# Patient Record
Sex: Male | Born: 1965 | Race: White | Hispanic: No | Marital: Married | Smoking: Current every day smoker
Health system: Southern US, Community
[De-identification: ages and names within clinical notes are randomized; demographics above are authoritative.]

## PROBLEM LIST (undated history)

## (undated) HISTORY — PX: SHOULDER SURGERY: SHX246

---

## 2006-01-19 ENCOUNTER — Ambulatory Visit: Payer: Self-pay | Admitting: Family Medicine

## 2006-02-04 ENCOUNTER — Ambulatory Visit: Payer: Self-pay | Admitting: Family Medicine

## 2006-02-11 ENCOUNTER — Ambulatory Visit: Payer: Self-pay | Admitting: Family Medicine

## 2006-02-23 ENCOUNTER — Ambulatory Visit: Payer: Self-pay | Admitting: Internal Medicine

## 2006-03-14 ENCOUNTER — Ambulatory Visit: Payer: Self-pay | Admitting: Family Medicine

## 2006-03-25 ENCOUNTER — Ambulatory Visit: Payer: Self-pay | Admitting: Internal Medicine

## 2006-09-13 DIAGNOSIS — G4700 Insomnia, unspecified: Secondary | ICD-10-CM | POA: Insufficient documentation

## 2006-09-13 DIAGNOSIS — F339 Major depressive disorder, recurrent, unspecified: Secondary | ICD-10-CM | POA: Insufficient documentation

## 2006-09-13 DIAGNOSIS — J45909 Unspecified asthma, uncomplicated: Secondary | ICD-10-CM | POA: Insufficient documentation

## 2006-12-15 ENCOUNTER — Ambulatory Visit: Payer: Self-pay | Admitting: Family Medicine

## 2006-12-15 ENCOUNTER — Encounter: Payer: Self-pay | Admitting: Family Medicine

## 2006-12-15 LAB — CONVERTED CEMR LAB: Inflenza A Ag: NEGATIVE

## 2007-05-09 ENCOUNTER — Ambulatory Visit: Payer: Self-pay | Admitting: Family Medicine

## 2007-05-09 DIAGNOSIS — L551 Sunburn of second degree: Secondary | ICD-10-CM | POA: Insufficient documentation

## 2008-06-24 ENCOUNTER — Ambulatory Visit: Payer: Self-pay | Admitting: Family Medicine

## 2008-06-24 DIAGNOSIS — R109 Unspecified abdominal pain: Secondary | ICD-10-CM | POA: Insufficient documentation

## 2008-06-24 DIAGNOSIS — R3129 Other microscopic hematuria: Secondary | ICD-10-CM | POA: Insufficient documentation

## 2008-06-24 LAB — CONVERTED CEMR LAB
Bilirubin Urine: NEGATIVE
Glucose, Urine, Semiquant: NEGATIVE
Nitrite: NEGATIVE
WBC Urine, dipstick: NEGATIVE

## 2008-06-25 ENCOUNTER — Encounter: Payer: Self-pay | Admitting: Family Medicine

## 2008-06-25 LAB — CONVERTED CEMR LAB

## 2008-06-27 LAB — CONVERTED CEMR LAB
ALT: 20 units/L (ref 0–53)
BUN: 13 mg/dL (ref 6–23)
CO2: 20 meq/L (ref 19–32)
Calcium: 9.5 mg/dL (ref 8.4–10.5)
Chloride: 104 meq/L (ref 96–112)
Creatinine, Ser: 1.14 mg/dL (ref 0.40–1.50)
Glucose, Bld: 122 mg/dL — ABNORMAL HIGH (ref 70–99)
Hemoglobin: 16.6 g/dL (ref 13.0–17.0)
Lymphocytes Relative: 29 % (ref 12–46)
Lymphs Abs: 2.7 10*3/uL (ref 0.7–4.0)
Monocytes Absolute: 0.5 10*3/uL (ref 0.1–1.0)
Monocytes Relative: 5 % (ref 3–12)
Neutro Abs: 6 10*3/uL (ref 1.7–7.7)
RBC: 5.83 M/uL — ABNORMAL HIGH (ref 4.22–5.81)

## 2008-07-11 ENCOUNTER — Ambulatory Visit: Payer: Self-pay | Admitting: Family Medicine

## 2008-07-11 DIAGNOSIS — R809 Proteinuria, unspecified: Secondary | ICD-10-CM | POA: Insufficient documentation

## 2008-07-11 LAB — CONVERTED CEMR LAB
Nitrite: NEGATIVE
WBC Urine, dipstick: NEGATIVE
pH: 6

## 2008-07-12 ENCOUNTER — Encounter: Admission: RE | Admit: 2008-07-12 | Discharge: 2008-07-12 | Payer: Self-pay | Admitting: Family Medicine

## 2008-12-18 ENCOUNTER — Ambulatory Visit: Payer: Self-pay | Admitting: Family Medicine

## 2009-01-02 ENCOUNTER — Telehealth: Payer: Self-pay | Admitting: Family Medicine

## 2009-07-30 IMAGING — US US RENAL
1 series · 14 of 25 positions shown · non-contrast
Comparison: None

CLINICAL DATA: Persistent hematuria.  Proteinuria.

RENAL/URINARY TRACT ULTRASOUND
TECHNIQUE: Complete ultrasound examination of the urinary tract
was performed including evaluation of the kidneys renal collecting
systems and urinary bladder.

[Series 1: us renal · 0.29mm/px · 14 of 35 slices shown]
[im 1/35]
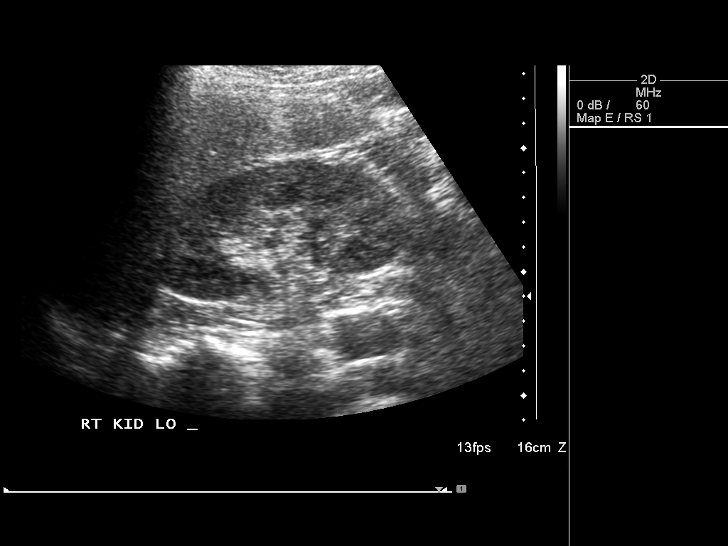
[im 3/35]
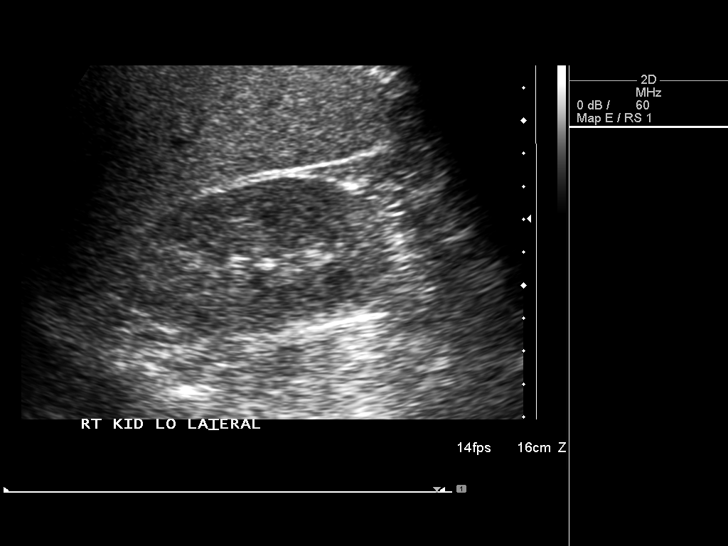
[im 6/35]
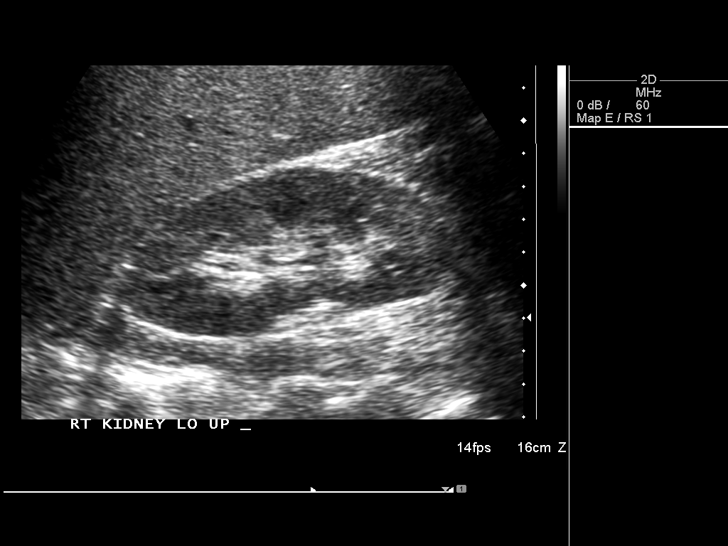
[im 9/35]
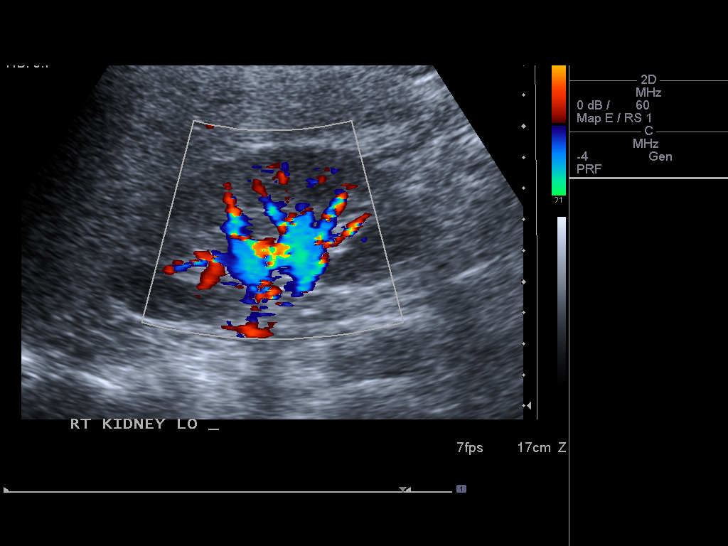
[im 12/35]
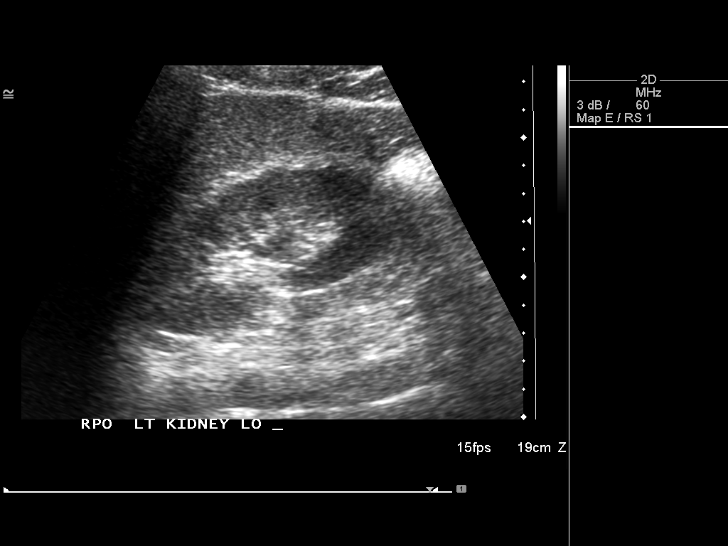
[im 13/35]
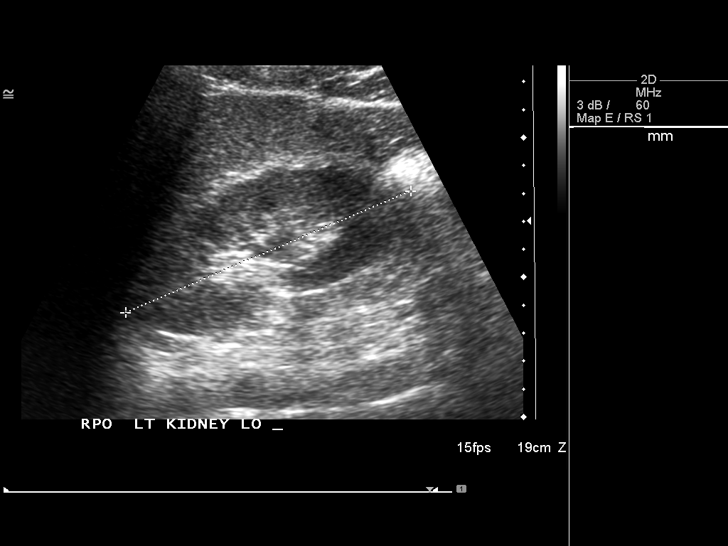
[im 16/35]
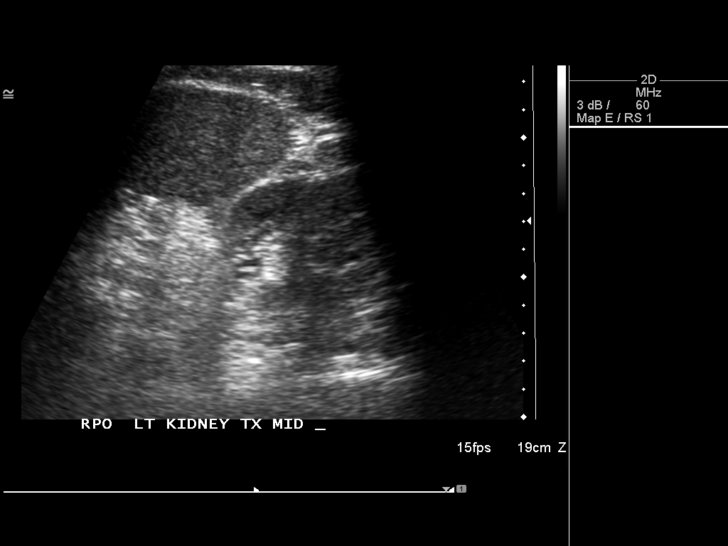
[im 19/35]
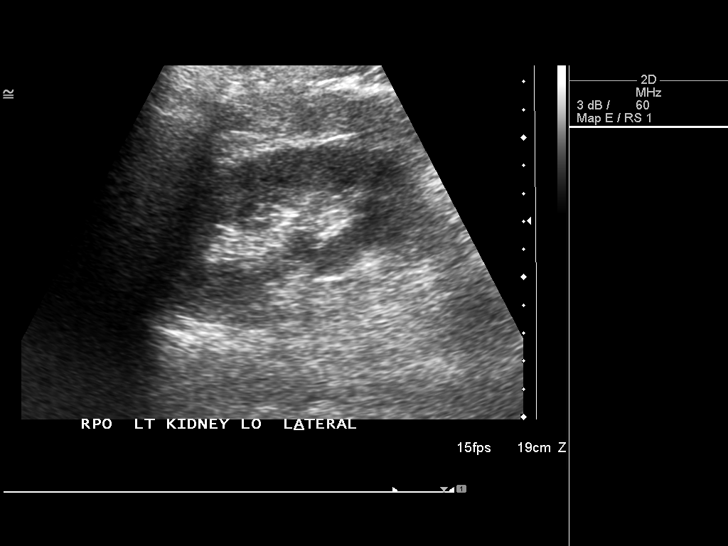
[im 22/35]
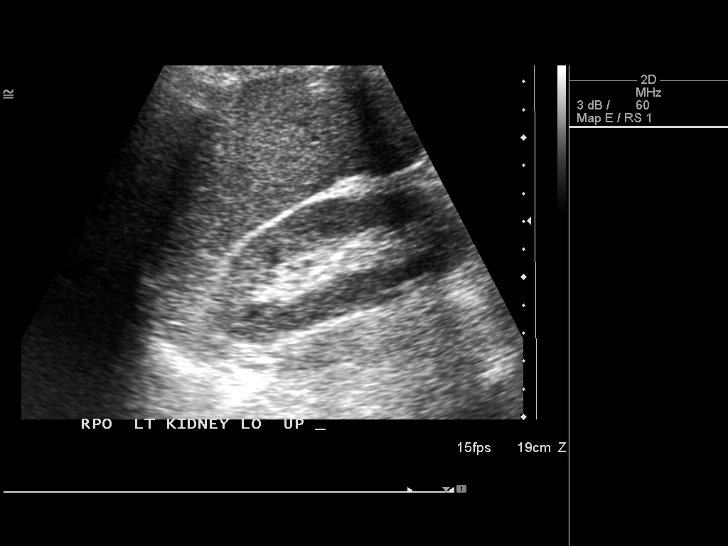
[im 23/35]
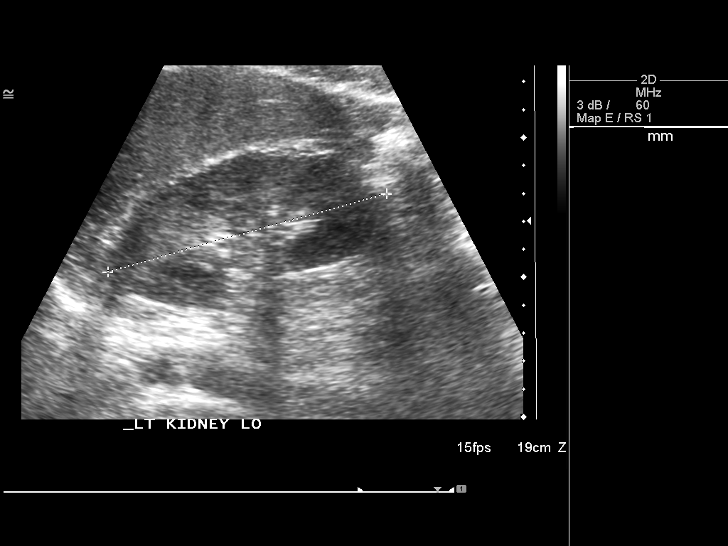
[im 26/35]
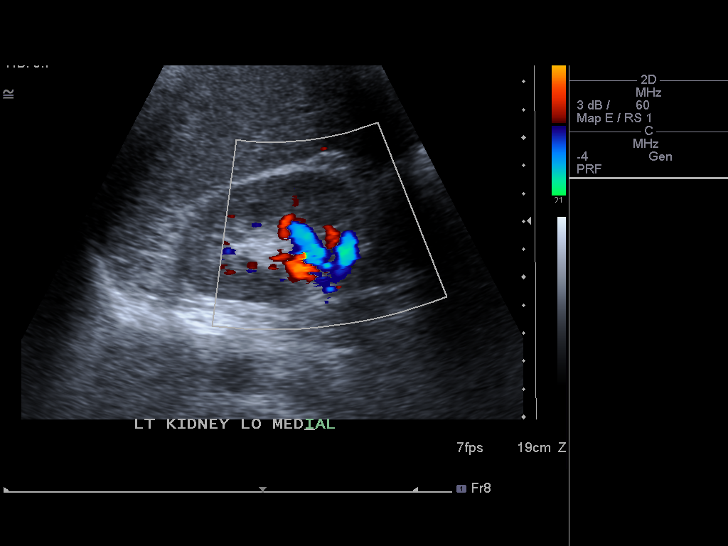
[im 29/35]
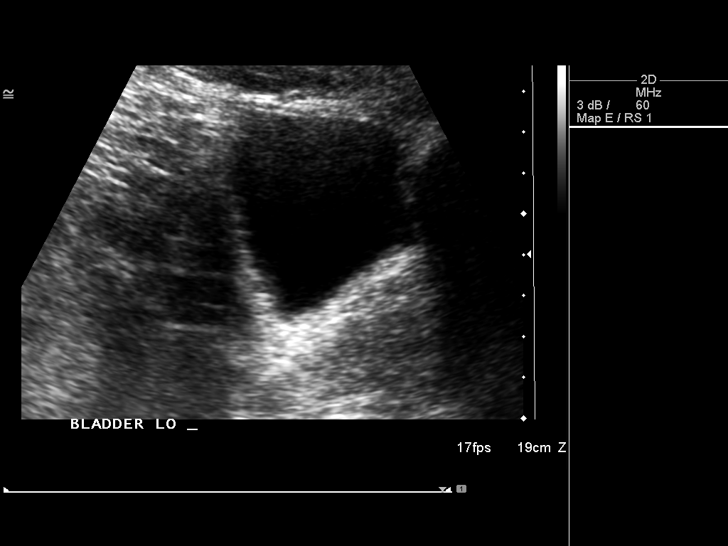
[im 32/35]
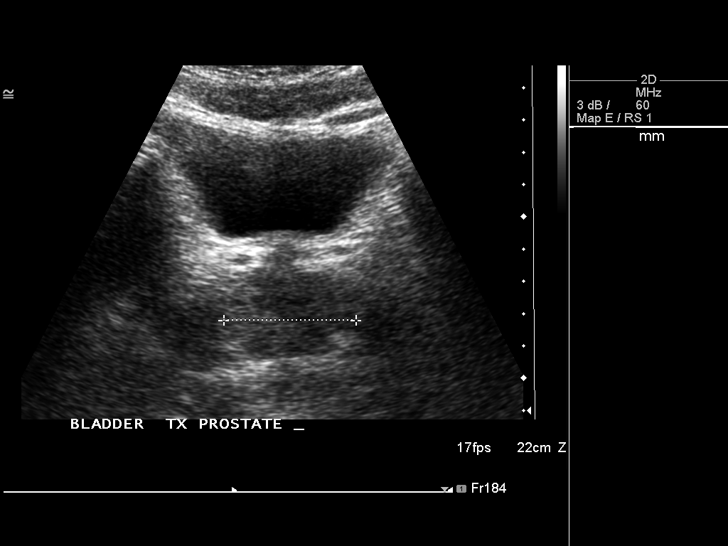
[im 35/35]
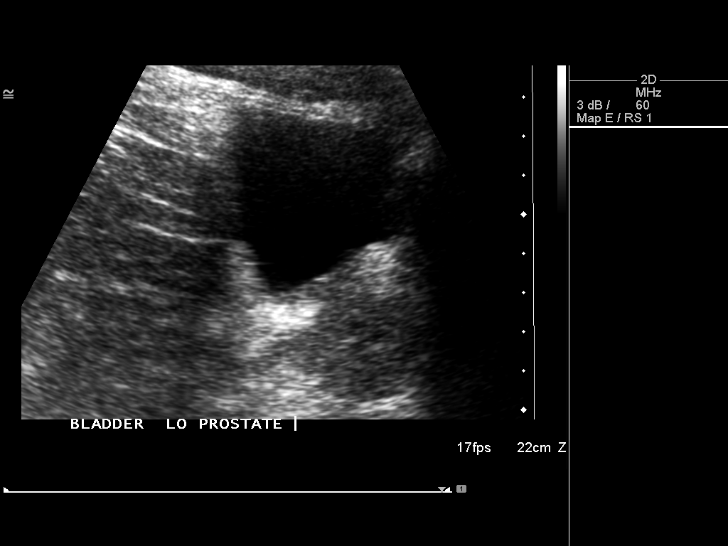

[14 of 25 positions shown; findings below may reference images not displayed]

FINDINGS: The right kidney measures 10.8 cm in long axis.  The left
kidney measures 11.1 cm.  Both kidneys are sonographically normal,
with no evidence for renal stones, hydronephrosis, or renal mass.

Midline imaging in the anatomic pelvis shows an unremarkable
urinary bladder.  Prostate gland appears slightly prominent for
age.
IMPRESSION: Normal urinary tract ultrasound.

## 2009-12-08 ENCOUNTER — Ambulatory Visit: Payer: Self-pay | Admitting: Family Medicine

## 2009-12-08 DIAGNOSIS — J1189 Influenza due to unidentified influenza virus with other manifestations: Secondary | ICD-10-CM | POA: Insufficient documentation

## 2009-12-30 ENCOUNTER — Ambulatory Visit: Payer: Self-pay | Admitting: Family Medicine

## 2009-12-30 DIAGNOSIS — J0100 Acute maxillary sinusitis, unspecified: Secondary | ICD-10-CM | POA: Insufficient documentation

## 2010-01-14 ENCOUNTER — Ambulatory Visit: Payer: Self-pay | Admitting: Family Medicine

## 2010-09-29 ENCOUNTER — Ambulatory Visit: Payer: Self-pay | Admitting: Family Medicine

## 2010-09-29 DIAGNOSIS — L270 Generalized skin eruption due to drugs and medicaments taken internally: Secondary | ICD-10-CM | POA: Insufficient documentation

## 2010-09-29 DIAGNOSIS — L299 Pruritus, unspecified: Secondary | ICD-10-CM | POA: Insufficient documentation

## 2010-10-01 ENCOUNTER — Telehealth (INDEPENDENT_AMBULATORY_CARE_PROVIDER_SITE_OTHER): Payer: Self-pay | Admitting: *Deleted

## 2011-01-05 NOTE — Assessment & Plan Note (Signed)
Summary: Eye drainage - R eye x today rm 2   Vital Signs:  Patient Profile:   45 Years Old Male CC:      Eye drainage - R eye x today Height:     71 inches (180.34 cm) Weight:      198 pounds O2 Sat:      100 % O2 treatment:    Room Air Temp:     97.1 degrees F oral Pulse rate:   101 / minute Pulse rhythm:   irregular Resp:     16 per minute BP sitting:   139 / 84  (right arm) Cuff size:   regular  Vitals Entered By: Areta Haber CMA (December 30, 2009 7:11 PM)                  Current Allergies: No known allergies History of Present Illness Chief Complaint: Eye drainage - R eye x today History of Present Illness: Subjective:  Patient complains of awakening this morning with bilateral watering of both eyes.  After going to work his left eye improved but the right eye became increasingly red with swelling of the lids.  No foreign body sensation.  No fevers, chills, and sweats.  He has had persistent nasal congestion after having had the flu earlier this month.  He still has a mild cough.   Current Problems: ACUTE MAXILLARY SINUSITIS (ICD-461.0) CONJUNCTIVITIS, ACUTE (ICD-372.00) INFLUENZA (ICD-487.8) URI (ICD-465.9) PROTEINURIA (ICD-791.0) ABDOMINAL CRAMPS (ICD-789.00) MICROSCOPIC HEMATURIA (ICD-599.72) SUNBURN, SECOND DEGREE (ICD-692.76) INSOMNIA NOS (ICD-780.52) DEPRESSION, MAJOR, RECURRENT (ICD-296.30) ASTHMA, UNSPECIFIED (ICD-493.90)   Current Meds CEFDINIR 300 MG CAPS (CEFDINIR) 1 by mouth q12hr TOBRAMYCIN SULFATE 0.3 % SOLN (TOBRAMYCIN SULFATE) 1 - 2 gtts in affected eye q4hr  REVIEW OF SYSTEMS Constitutional Symptoms      Denies fever, chills, night sweats, weight loss, weight gain, and fatigue.  Eyes       Complains of eye drainage.      Denies change in vision, eye pain, glasses, contact lenses, and eye surgery.      Comments: R eye x today Ear/Nose/Throat/Mouth       Denies hearing loss/aids, change in hearing, ear pain, ear discharge, dizziness,  frequent runny nose, frequent nose bleeds, sinus problems, sore throat, hoarseness, and tooth pain or bleeding.  Respiratory       Denies dry cough, productive cough, wheezing, shortness of breath, asthma, bronchitis, and emphysema/COPD.  Cardiovascular       Denies murmurs, chest pain, and tires easily with exhertion.    Gastrointestinal       Denies stomach pain, nausea/vomiting, diarrhea, constipation, blood in bowel movements, and indigestion. Genitourniary       Denies painful urination, kidney stones, and loss of urinary control. Neurological       Denies paralysis, seizures, and fainting/blackouts. Musculoskeletal       Denies muscle pain, joint pain, joint stiffness, decreased range of motion, redness, swelling, muscle weakness, and gout.  Skin       Denies bruising, unusual mles/lumps or sores, and hair/skin or nail changes.  Psych       Denies mood changes, temper/anger issues, anxiety/stress, speech problems, depression, and sleep problems.  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  Married to [employer] with 2  sons.  Also mother-in-law and 8 yr grandson live in home.  Quit smoking 5 yrs ago, no EtOH, no drgus, 4-5 caffeinated drinks daily, no regular exercise.  Risk Factors: Smoking Status: current (12/15/2006)   Objective:  No acute distress  Eyes:  Pupils are equal, round, and reactive to light and accomdation.  Extraocular movement is intact.   Right conjuctivae injected with mucopurulent discharge.  Right eyelids slightly swollen but nontender.  Mild right photophobia Nose:  Normal septum.  Normal turbinates, mildly congested.  Right maxillary sinus tenderness present.  Pharynx:  Normal  Neck:  Supple.   No adenopathy is present.  No thyromegaly is present  Lungs:  Clear to auscultation.  Breath sounds are equal.  Heart:  Regular rate and rhythm without murmurs, rubs, or gallops.   Assessment New Problems: ACUTE MAXILLARY SINUSITIS (ICD-461.0) CONJUNCTIVITIS, ACUTE (ICD-372.00)  CONJUNCTIVITIS.  SUSPECT UNDERLYING SINUSITIS   Plan New Medications/Changes: TOBRAMYCIN SULFATE 0.3 % SOLN (TOBRAMYCIN SULFATE) 1 - 2 gtts in affected eye q4hr  #5cc x 0, 12/30/2009, Donna Christen MD CEFDINIR 300 MG CAPS (CEFDINIR) 1 by mouth q12hr  #20 x 0, 12/30/2009, Donna Christen MD  New Orders: New Patient Level III [phone] Planning Comments:   Begin Omnicef.  Begin Tobramycin ophthalmic drops.  Expectorant/decongestant. Follow-up with ophthalmologist if not improved 2 to 3 days.  Return for worsening symptoms   The patient and/or caregiver has been counseled thoroughly with regard to medications prescribed including dosage, schedule, interactions, rationale for use, and possible side effects and they verbalize understanding.  Diagnoses and expected course of recovery discussed and will return if not improved as expected or if the condition worsens. Patient and/or caregiver verbalized understanding.  Prescriptions: TOBRAMYCIN SULFATE 0.3 % SOLN (TOBRAMYCIN SULFATE) 1 - 2 gtts in affected eye q4hr  #5cc x 0   Entered and Authorized by:   Donna Christen MD   Signed by:   Donna Christen MD on 12/30/2009   Method used:   Print then Give to Patient   RxID:   [id number] CEFDINIR 300 MG CAPS (CEFDINIR) 1 by mouth q12hr  #20 x 0   Entered and Authorized by:   Donna Christen MD   Signed by:   Donna Christen MD on 12/30/2009   Method used:   Print then Give to Patient   RxID:   [id number]   Patient Instructions: 1)  May use Mucinex D (guaifenesin with decongestant) twice daily for congestion. 2)  Increase fluid intake, rest. 3)  May use Afrin nasal spray (or generic oxymetazoline) twice daily for  about 5 days.  Also recommend using saline nasal spray several times daily and/or saline nasal irrigation. 4)  Followup with ophthalmologist if not improving 2 to 3 days.

## 2011-01-05 NOTE — Letter (Signed)
Summary: Out of Work  [facility]  [address]   [place], [place] [address]   Phone: [phone]  Fax: [phone]    December 30, 2009   Employee:  Eddie Best [facility]    To Whom It May Concern:   For Medical reasons, please excuse the above named employee from work tomorrow and 01/01/2010.    If you need additional information, please feel free to contact our office.         Sincerely,    Donna Christen MD

## 2011-01-05 NOTE — Letter (Signed)
Summary: Out of Work  [facility]  [address], [address]   [place], [place] [address]   Phone: [phone]  Fax: [phone]    January 14, 2010   Employee:  Eddie Best [facility]    To Whom It May Concern:   For Medical reasons, please excuse the above named employee from work for the following dates:  Start:   2--08-2010  End:   01-19-2010  If you need additional information, please feel free to contact our office.         Sincerely,    Nani Gasser MD

## 2011-01-05 NOTE — Progress Notes (Signed)
  Phone Note Outgoing Call Call back at [facility] Phone [phone]   Call placed by: Lajean Saver RN,  October 01, 2010 12:24 PM Call placed to: Patient Action Taken: Phone Call Completed Summary of Call: Callback: Patient states the rash is almost gone but he is still a little sore in his hips. I advised himt to be sure to finish the prednisone.

## 2011-01-05 NOTE — Letter (Signed)
Summary: Out of Work  [facility]  [address]   [place], [place] [address]   Phone: [phone]  Fax: [phone]    December 08, 2009   Employee:  ADONI GREENOUGH [facility]    To Whom It May Concern:   For Medical reasons, please excuse the above named employee from work for the following dates:  Start:   Jan 4th  End:   Jan 5th  If you need additional information, please feel free to contact our office.         Sincerely,    Seymour Bars DO

## 2011-01-05 NOTE — Assessment & Plan Note (Signed)
Summary: Allergic reaction to med x this eveing rm 3  (left arm) Cuff size:   regular  Vitals Entered By: Areta Haber CMA (September 29, 2010 8:04 PM)                   Current Allergies: No known allergies History of Present Illness Chief Complaint: Allergic reaction x this evening History of Present Illness:  Subjective:  Patient had some soreness in his right shoulder today so took some old etodolac that had expired.  About 2 hours ago he began developing generalized itching without other symptoms.  No shortness of breath or difficulty swallowing.  He feels well otherwise.  No fevers, chills, and sweats.  He took 50mg  Benadryl po  Current Problems: PRURITUS (ICD-698.9) CUTANEOUS ERUPTIONS, DRUG-INDUCED (ICD-693.0) ACUTE MAXILLARY SINUSITIS (ICD-461.0) CONJUNCTIVITIS, ACUTE (ICD-372.00) INFLUENZA (ICD-487.8) PROTEINURIA (ICD-791.0) ABDOMINAL CRAMPS (ICD-789.00) MICROSCOPIC HEMATURIA (ICD-599.72) SUNBURN, SECOND DEGREE (ICD-692.76) INSOMNIA NOS (ICD-780.52) DEPRESSION, MAJOR, RECURRENT (ICD-296.30) ASTHMA, UNSPECIFIED (ICD-493.90)   Current Meds BENADRYL 25 MG TABS (DIPHENHYDRAMINE HCL) as directed ETODOLAC CR 400 MG XR24H-TAB (ETODOLAC) 1 tab by mouth two times a day PREDNISONE 10 MG TABS (PREDNISONE) 2 PO BID for 2 days, then 1 BID for 2 days, then 1 daily for 2 days.  Take PC  REVIEW OF SYSTEMS Constitutional Symptoms      Denies fever, chills, night sweats, weight loss, weight gain, and fatigue.  Eyes       Denies change in vision, eye pain, eye discharge, glasses, contact lenses, and eye surgery. Ear/Nose/Throat/Mouth       Denies hearing loss/aids, change in hearing, ear pain, ear discharge, dizziness, frequent runny nose, frequent nose bleeds, sinus problems, sore throat, hoarseness, and tooth pain or bleeding.  Respiratory       Denies dry cough, productive cough, wheezing, shortness of breath, asthma, bronchitis, and emphysema/COPD.  Cardiovascular    Denies murmurs, chest pain, and tires easily with exhertion.    Gastrointestinal       Denies stomach pain, nausea/vomiting, diarrhea, constipation, blood in bowel movements, and indigestion. Genitourniary       Denies painful urination, kidney stones, and loss of urinary control. Neurological       Denies paralysis, seizures, and fainting/blackouts. Musculoskeletal       Denies muscle pain, joint pain, joint stiffness, decreased range of motion, redness, swelling, muscle weakness, and gout.  Skin       Denies bruising, unusual mles/lumps or sores, and hair/skin or nail changes.      Comments: All over Psych       Denies mood changes, temper/anger issues, anxiety/stress, speech problems, depression, and sleep problems. Other Comments: Pt states he took expired Etodolac for shoulder pain this eveing and started itching all over.   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  Married to [employer] with 2 sons.  Also mother-in-law and 8 yr grandson live in home.  Quit smoking 5 yrs ago, no EtOH, no drgus, 4-5 caffeinated drinks daily, no regular exercise.  Risk Factors: Smoking Status: current (12/15/2006)   Objective:  Appearance:  Patient appears healthy, stated age, and in no acute distress  Skin:  General erythema without urticaria Eyes:  Pupils are equal, round, and reactive to light and accomdation.  Extraocular movement is  intact.  Conjunctivae are not inflamed.  Mouth:  No lesions Pharynx:  Normal  Neck:  Supple.  No adenopathy is present.  No thyromegaly is present  Lungs:  Clear to auscultation.  Breath sounds are equal.  Heart:  Regular rate and rhythm without murmurs, rubs, or gallops.  Abdomen:  Nontender  without masses or hepatosplenomegaly.  Bowel sounds are present.  No CVA or flank tenderness.  Assessment New Problems: PRURITUS (ICD-698.9) CUTANEOUS ERUPTIONS, DRUG-INDUCED (ICD-693.0)   Plan New Medications/Changes: PREDNISONE 10 MG TABS (PREDNISONE) 2 PO BID for 2 days, then 1 BID for 2 days, then 1 daily for 2 days.  Take PC  #14 x 0, 09/29/2010, Donna Christen MD  New Orders: Depo- Medrol 80mg  [J1040] Admin of Therapeutic Inj  intramuscular or subcutaneous [96372] Est. Patient Level IV [76283] Planning Comments:   Depo Medrol 80mg  IM.  Take 50mg  Benadryl about 4hr from now at home.  Tomorrow begin tapering dose of prednisone. If symptoms become significantly worse during the night, proceed to the local emergency room.  Return for development fever, etc.   The patient and/or caregiver has been counseled thoroughly with regard to medications prescribed including dosage, schedule, interactions, rationale for use, and possible side effects and they verbalize understanding.  Diagnoses and expected course of recovery discussed and will return if not improved as expected or if the condition worsens. Patient and/or caregiver verbalized understanding.  Prescriptions: PREDNISONE 10 MG TABS (PREDNISONE) 2 PO BID for 2 days, then 1 BID for 2 days, then 1 daily for 2 days.  Take PC  #14 x 0   Entered and Authorized by:   Donna Christen MD   Signed by:   Donna Christen MD on 09/29/2010   Method used:   Print then Give to Patient   RxID:   [id number]   Medication Administration  Injection # 1:    Medication: Depo- Medrol 80mg     Diagnosis: CUTANEOUS ERUPTIONS, DRUG-INDUCED (ICD-693.0)    Route: IM    Site: RUOQ gluteus    Exp Date: 04/05/2011    Lot #: [id number]    Mfr: Pharmacia    Given by: Areta Haber CMA (September 29, 2010 8:41 PM)  Orders Added: 1)  Depo- Medrol 80mg  [J1040] 2)  Admin of Therapeutic Inj  intramuscular or subcutaneous [96372] 3)  Est. Patient Level IV  [94854]     Medication Administration  Injection # 1:    Medication: Depo- Medrol 80mg     Diagnosis: CUTANEOUS ERUPTIONS, DRUG-INDUCED (ICD-693.0)    Route: IM    Site: RUOQ gluteus    Exp Date: 04/05/2011    Lot #: [id number]    Mfr: [employer]acia    Given by: Areta Haber CMA (September 29, 2010 8:41 PM)  Orders Added: 1)  Depo- Medrol 80mg  [J1040] 2)  Admin of Therapeutic Inj  intramuscular or subcutaneous [96372] 3)  Est. Patient Level IV [62703]

## 2011-01-05 NOTE — Letter (Signed)
Summary: Out of Work  [facility]  [address]   [place], [place] [address]   Phone: [phone]  Fax: [phone]    September 29, 2010   Employee:  Eddie Best [facility]    To Whom It May Concern:   For Medical reasons, please excuse the above named employee from work tomorrow.    If you need additional information, please feel free to contact our office.         Sincerely,    Donna Christen MD

## 2011-01-05 NOTE — Assessment & Plan Note (Signed)
Summary: flu   Vital Signs:  Patient profile:   45 year old male Height:      71 inches Weight:      201 pounds BMI:     28.14 O2 Sat:      98 % on Room air Temp:     98.0 degrees F oral Pulse rate:   108 / minute BP sitting:   136 / 91  (left arm) Cuff size:   large  Vitals Entered By: Payton Spark CMA (December 08, 2009 2:18 PM)  O2 Flow:  Room air CC: Productive cough and achy x 4 days.    Primary Care Provider:  Nani Gasser MD  CC:  Productive cough and achy x 4 days. Marland Kitchen  History of Present Illness: 45 yo WM presents for 5 days of cough and bodyaches.  Cough is slightly productive, esp in the AM.  Feels really tired.  He did not get a flu shot.  He is a smoker.  He does not have in inhaler.  No sick contacts.  Alka Seltzer Cold and Sudafed have helped some.  Cough does keep him up at night.  He felt like he has chills and subjective fevers.  No nausea or rash.  Appetite is OK.  He worked today.  Hx of asthma but has not had any chest tightness or wheezing.    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  Married to [employer] with 2 sons.  Also mother-in-law and 8 yr grandson live in home.  Quit smoking 5 yrs ago, no EtOH, no drgus, 4-5 caffeinated drinks daily, no regular exercise.  Review of Systems      See HPI  Physical Exam  General:  alert, well-developed, well-nourished, and well-hydrated.   Head:  normocephalic and atraumatic.  sinuses NTTP Eyes:  conjunctiva clear Ears:  EACs patent; TMs translucent and gray with good cone of light and bony landmarks.  Nose:  nasal congestion present Mouth:  throat injected but no tonsillar hypertrophy, exudates or vesicles Neck:  no masses.   Lungs:  Normal respiratory effort, chest expands symmetrically. Lungs are clear to auscultation, no crackles or  wheezes.  dry cough. Heart:  Normal rate and regular rhythm. S1 and S2 normal without gallop, murmur, click, rub or other extra sounds. Extremities:  no LE edema Skin:  color normal.   Cervical Nodes:  enlarged anterior cervical chain LA   Impression & Recommendations:  Problem # 1:  INFLUENZA (ICD-487.8) Day 5 of acute influenza illness w/o complication. Fever/ chills/ bodyaches/ cough suggest flu with abscence of seasonal flu vaccine. Treat supportively with Advil Cold and Flu for daytime and Tussionex at night. Encourage clear fluids, avoid contact spread.  Out of work note given for tomorrow. Call if not improved in 4 days.  Complete Medication List: 1)  Tussionex Pennkinetic Er 8-10 Mg/17ml Lqcr (Chlorpheniramine-hydrocodone) .... 5 ml by mouth at bedtime as needed cough  Patient Instructions: 1)  The flu usually lasts 7-10 days. 2)  You can take Advil Cold and Flu for symptomatic relief during the day and tussionex at night to help you sleep. 3)  Drink plenty of clear fluids. 4)  Get your rest. 5)  Call if symptoms have not resolved in 4 days. Prescriptions: TUSSIONEX PENNKINETIC ER 8-10 MG/5ML LQCR (CHLORPHENIRAMINE-HYDROCODONE) 5 ml by mouth at  bedtime as needed cough  #100 ml x 0   Entered and Authorized by:   Seymour Bars DO   Signed by:   Seymour Bars DO on 12/08/2009   Method used:   Printed then faxed to ...       [facility] [phone]* (retail)       [address] [place], [place]  [address]       Ph: [phone]       Fax: [phone]   RxID:   [id number]

## 2011-01-05 NOTE — Assessment & Plan Note (Signed)
Summary: Flu   Vital Signs:  Patient profile:   45 year old male Height:      71 inches Weight:      200 pounds BMI:     28.00 O2 Sat:      98 % on Room air Temp:     98.9 degrees F oral Pulse rate:   118 / minute BP sitting:   145 / 79  (right arm) Cuff size:   large  Vitals Entered By: Payton Spark CMA (January 14, 2010 3:03 PM)  O2 Flow:  Room air CC: Cough, achy x 2 days   Primary Care Provider:  Nani Gasser MD  CC:  Cough and achy x 2 days.  History of Present Illness: Cough and achy for about 2 days.  Chills. No diarrhea.  Nauseated, no vomiting. + HA, + runny nose. Taking Mucinex -D.  BP a little elevated today.  PUt on cefdinir for sinusitis on 1/25. Didn't finish tabs.    Current Medications (verified): 1)  Cefdinir 300 Mg Caps (Cefdinir) .Marland Kitchen.. 1 By Mouth Q12hr 2)  Tobramycin Sulfate 0.3 % Soln (Tobramycin Sulfate) .Marland Kitchen.. 1 - 2 Gtts in Affected Eye Q4hr  Allergies (verified): No Known Drug Allergies  Physical Exam  General:  Well-developed,well-nourished,in no acute distress; alert,appropriate and cooperative throughout examination Head:  Normocephalic and atraumatic without obvious abnormalities. No apparent alopecia or balding. Eyes:  No corneal or conjunctival inflammation noted. EOMI. Perrla. Ears:  External ear exam shows no significant lesions or deformities.  Otoscopic examination reveals clear canals, tympanic membranes are intact bilaterally without bulging, retraction, inflammation or discharge. Hearing is grossly normal bilaterally. Nose:  External nasal examination shows no deformity or inflammation.  Mouth:  Oral mucosa and oropharynx without lesions or exudates.  Teeth in good repair. Neck:  No deformities, masses, or tenderness noted. Lungs:  Normal respiratory effort, chest expands symmetrically. Lungs are clear to auscultation, no crackles or wheezes. Heart:  Normal rate and regular rhythm. S1 and S2 normal without gallop, murmur, click,  rub or other extra sounds. Abdomen:  Bowel sounds positive,abdomen soft and non-tender without masses, organomegaly or hernias noted. Pulses:  Radial 2+  Skin:  no rashes.   Cervical Nodes:  No lymphadenopathy noted Psych:  Cognition and judgment appear intact. Alert and cooperative with normal attention span and concentration. No apparent delusions, illusions, hallucinations   Impression & Recommendations:  Problem # 1:  INFLUENZA (ICD-487.8)  Still has some cough med at home. Discussed can use tamiflu if wouldlike since in the first 48 hours. Pt agrees.  Will send to pharm. Call if runs out of cough medicine. Symptomatic care.   Orders: Flu A+B ([address])  Complete Medication List: 1)  Cefdinir 300 Mg Caps (Cefdinir) .Marland Kitchen.. 1 by mouth q12hr 2)  Tobramycin Sulfate 0.3 % Soln (Tobramycin sulfate) .Marland Kitchen.. 1 - 2 gtts in affected eye q4hr 3)  Tamiflu 75 Mg Caps (Oseltamivir phosphate) .... Take one tablet by mouth twice a day for five days Prescriptions: TAMIFLU 75 MG CAPS (OSELTAMIVIR PHOSPHATE) take one tablet by mouth twice a day for five days  #10 x 0   Entered and Authorized by:   Nani Gasser MD   Signed by:   Nani Gasser MD on 01/14/2010   Method used:   Electronically to        [facility] [phone]* (retail)       [address]       [place], [place]  [address]  Ph: [phone]       Fax: [phone]   RxID:   [id number]   Laboratory Results    Other Tests  Influenza A: positive Influenza B: negative

## 2011-03-10 ENCOUNTER — Encounter: Payer: Self-pay | Admitting: Family Medicine

## 2011-03-10 ENCOUNTER — Ambulatory Visit (INDEPENDENT_AMBULATORY_CARE_PROVIDER_SITE_OTHER): Payer: Commercial Indemnity | Admitting: Family Medicine

## 2011-03-10 VITALS — BP 142/85 | HR 82 | Temp 98.1°F | Ht 71.0 in | Wt 187.0 lb

## 2011-03-10 DIAGNOSIS — J45909 Unspecified asthma, uncomplicated: Secondary | ICD-10-CM

## 2011-03-10 DIAGNOSIS — J019 Acute sinusitis, unspecified: Secondary | ICD-10-CM

## 2011-03-10 MED ORDER — DOXYCYCLINE HYCLATE 100 MG PO CAPS: 100.0000 mg | ORAL_CAPSULE | Freq: Two times a day (BID) | ORAL | Status: AC

## 2011-03-10 MED ORDER — FLUTICASONE PROPIONATE 50 MCG/ACT NA SUSP: 2.0000 | Freq: Every day | NASAL | Status: DC

## 2011-03-10 NOTE — Patient Instructions (Signed)
For sinusitis:  Take 10 days of doxycyline (with breakfast and dinner).  For allergies, stay on Zyrtec at night and add Flonase 2 sprays/ nostril everyday thru spring.  Use OTC Mucinex DM for cough and congestion and avoid smoking.  Call if not improved in 10 days.

## 2011-03-10 NOTE — Progress Notes (Signed)
  Subjective:    Patient ID: TRACKER MANCE, male    DOB: 1966-06-08, 45 y.o.   MRN: [id number]  HPI  45 yo WM presents for R sided facial pressure x 2 days.  He has some head congestion and has underlying seasonal allergies.  He takes Zyrtec daily which is helping his sneezing but the facial pressure and pain is still bad.  He has some cough and chest tightness.  He is coughing up phlegm for the past week.  He feels tired.  He is a smoker. Denies F/C.    BP 142/85  Pulse 82  Temp(Src) 98.1 F (36.7 C) (Oral)  Ht 5\' 11"  (1.803 m)  Wt 187 lb (84.823 kg)  BMI 26.08 kg/m2  SpO2 98%  PF 600 L/min   Review of Systems  Constitutional: Positive for fatigue. Negative for fever and unexpected weight change.  HENT: Positive for congestion, rhinorrhea, sneezing, postnasal drip and sinus pressure. Negative for hearing loss, ear pain, nosebleeds and sore throat.   Eyes: Negative for redness and itching.  Respiratory: Positive for cough. Negative for shortness of breath and wheezing.   Cardiovascular: Negative for chest pain and palpitations.  Gastrointestinal: Negative for nausea and diarrhea.  Neurological: Positive for headaches.       Objective:   Physical Exam  Constitutional: He appears well-developed and well-nourished. No distress.  HENT:  Head: Normocephalic and atraumatic.  Right Ear: External ear normal.  Left Ear: External ear normal.  Nose: Nose normal.  Mouth/Throat: Oropharynx is clear and moist.       Nasal congestion w/ boggy turbinates present Frontal and R maxillary sinus TTP  Eyes: Conjunctivae are normal.  Cardiovascular: Normal rate, regular rhythm and normal heart sounds.   No murmur heard. Pulmonary/Chest: Effort normal and breath sounds normal. He has no wheezes.       Dry cough  Musculoskeletal: He exhibits no edema.  Lymphadenopathy:    He has cervical adenopathy.  Skin: Skin is warm and dry. No rash noted.  Psychiatric: He has a normal mood and affect.            Assessment & Plan:  1. Sinusitis secondary to underlying allergies, sub- optimally treated. Will start him on Doxycycline 100 mg bid x 10 days for sinusitis.  Stay on Zyrtec EVERY night and add Flonase daily thru Spring.  OK to add Mucinex DM for cough and congestion for the next 10-14 days.  Avoid smoking.  PFs in green zone, so did not treat for asthma exacerbation.  Call if any problems.

## 2011-12-20 ENCOUNTER — Other Ambulatory Visit: Payer: Self-pay | Admitting: Family Medicine

## 2011-12-20 ENCOUNTER — Ambulatory Visit (INDEPENDENT_AMBULATORY_CARE_PROVIDER_SITE_OTHER): Payer: Commercial Indemnity | Admitting: Family Medicine

## 2011-12-20 VITALS — BP 129/82 | HR 89 | Wt 206.0 lb

## 2011-12-20 DIAGNOSIS — Z720 Tobacco use: Secondary | ICD-10-CM

## 2011-12-20 DIAGNOSIS — R61 Generalized hyperhidrosis: Secondary | ICD-10-CM

## 2011-12-20 DIAGNOSIS — G9050 Complex regional pain syndrome I, unspecified: Secondary | ICD-10-CM | POA: Insufficient documentation

## 2011-12-20 DIAGNOSIS — F17200 Nicotine dependence, unspecified, uncomplicated: Secondary | ICD-10-CM

## 2011-12-20 NOTE — Patient Instructions (Signed)
We will call you with your lab results. If you don't here from us in about a week then please give us a call at [phone].  

## 2011-12-20 NOTE — Progress Notes (Signed)
  Subjective:    Patient ID: Eddie Best, male    DOB: 03/23/1966, 46 y.o.   MRN: [id number]  HPI Not on any medications.  He is on vicodone for now for a foot injury as a forklift ran over his foot.  Has been feeling really sweaty every day. Has had some weight gain since his inury. Sweats started before the vicodin and doesn't use the pain med daily.  No fever. No bowel issues.  No vision changes.  Occ HA. Using NSAIDs occasionally.  No CP or SOB. No problems with his asthma. Does have a hx of Reflux. No skin changes or abnormal moles. +smoker.    Review of Systems  BP 129/82  Pulse 89  Wt 206 lb (93.441 kg)    No Known Allergies  No past medical history on file.  No past surgical history on file.  History   Social History  . Marital Status: Married    Spouse Name: N/A    Number of Children: N/A  . Years of Education: N/A   Occupational History  . Not on file.   Social History Main Topics  . Smoking status: Current Everyday Smoker -- 1.0 packs/day  . Smokeless tobacco: Not on file  . Alcohol Use: Not on file  . Drug Use: Not on file  . Sexually Active: Not on file   Other Topics Concern  . Not on file   Social History Narrative  . No narrative on file    No family history on file.      Objective:   Physical Exam  Constitutional: He is oriented to person, place, and time. He appears well-developed and well-nourished.  HENT:  Head: Normocephalic and atraumatic.  Right Ear: External ear normal.  Left Ear: External ear normal.  Nose: Nose normal.  Mouth/Throat: Oropharynx is clear and moist.       TMS and canals are clear bilat.   Eyes: Conjunctivae and EOM are normal. Pupils are equal, round, and reactive to light.  Neck: Normal range of motion. Neck supple. No thyromegaly present.  Cardiovascular: Normal rate, regular rhythm, normal heart sounds and intact distal pulses.   Pulmonary/Chest: Effort normal and breath sounds normal.  Abdominal: Soft.  Bowel sounds are normal. He exhibits no distension and no mass. There is no tenderness. There is no rebound and no guarding.  Musculoskeletal: Normal range of motion. He exhibits no edema.  Lymphadenopathy:    He has no cervical adenopathy.  Neurological: He is alert and oriented to person, place, and time. He has normal reflexes.  Skin: Skin is warm and dry.  Psychiatric: He has a normal mood and affect. His behavior is normal. Judgment and thought content normal.          Assessment & Plan:  Diaphoresis - Will check TSH, CBC w/ diff. Sed rate.  Unclear etiology. He has no other specific symptoms except for sweating. The only difference is he has had some recent weight gain consistent injury 6 months ago and has gained a few pounds. He has no prior history of thyroid disorder. He feels his asthma is well controlled. His blood pressure looks great today. Consider further workup with a chest x-ray if his labs are completely normal. He also denies any skin changes. He also does not take any medications including NSAIDs on a daily basis that could be causing or exacerbating his symptoms.

## 2011-12-21 LAB — SEDIMENTATION RATE: Sed Rate: 4 mm/hr (ref 0–16)

## 2011-12-21 LAB — CBC WITH DIFFERENTIAL/PLATELET
Basophils Absolute: 0 10*3/uL (ref 0.0–0.1)
Eosinophils Relative: 2 % (ref 0–5)
HCT: 48.7 % (ref 39.0–52.0)
Lymphocytes Relative: 35 % (ref 12–46)
Lymphs Abs: 4.1 10*3/uL — ABNORMAL HIGH (ref 0.7–4.0)
MCV: 85.4 fL (ref 78.0–100.0)
Monocytes Absolute: 0.8 10*3/uL (ref 0.1–1.0)
Monocytes Relative: 7 % (ref 3–12)
RDW: 13.7 % (ref 11.5–15.5)
WBC: 11.8 10*3/uL — ABNORMAL HIGH (ref 4.0–10.5)

## 2011-12-21 LAB — COMPLETE METABOLIC PANEL WITH GFR
Albumin: 5.1 g/dL (ref 3.5–5.2)
BUN: 11 mg/dL (ref 6–23)
CO2: 24 mEq/L (ref 19–32)
Calcium: 10.1 mg/dL (ref 8.4–10.5)
Chloride: 100 mEq/L (ref 96–112)
GFR, Est Non African American: 80 mL/min
Glucose, Bld: 85 mg/dL (ref 70–99)
Potassium: 4.4 mEq/L (ref 3.5–5.3)

## 2011-12-23 ENCOUNTER — Other Ambulatory Visit: Payer: Self-pay | Admitting: Family Medicine

## 2011-12-23 MED ORDER — LEVOTHYROXINE SODIUM 25 MCG PO TABS: 25.0000 ug | ORAL_TABLET | Freq: Every day | ORAL | Status: DC

## 2012-01-28 ENCOUNTER — Encounter: Payer: Self-pay | Admitting: *Deleted

## 2012-02-03 ENCOUNTER — Ambulatory Visit (INDEPENDENT_AMBULATORY_CARE_PROVIDER_SITE_OTHER): Payer: Commercial Indemnity | Admitting: Family Medicine

## 2012-02-03 ENCOUNTER — Encounter: Payer: Self-pay | Admitting: Family Medicine

## 2012-02-03 VITALS — BP 141/82 | HR 93 | Ht 71.0 in | Wt 206.0 lb

## 2012-02-03 DIAGNOSIS — IMO0001 Reserved for inherently not codable concepts without codable children: Secondary | ICD-10-CM

## 2012-02-03 DIAGNOSIS — Z13220 Encounter for screening for lipoid disorders: Secondary | ICD-10-CM

## 2012-02-03 DIAGNOSIS — E038 Other specified hypothyroidism: Secondary | ICD-10-CM

## 2012-02-03 DIAGNOSIS — R030 Elevated blood-pressure reading, without diagnosis of hypertension: Secondary | ICD-10-CM

## 2012-02-03 DIAGNOSIS — E039 Hypothyroidism, unspecified: Secondary | ICD-10-CM

## 2012-02-03 NOTE — Progress Notes (Signed)
  Subjective:    Patient ID: Eddie Best, male    DOB: 1966/04/08, 46 y.o.   MRN: [id number]  HPI Subclinical hypothyroid - Says the excessive sweating has resolved on the medication. Says does he have to stay on it?  No SE, has tolerated it well. 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   He is still smoking. No interested in quitting at this time.   Review of Systems     Objective:   Physical Exam  Constitutional: He is oriented to person, place, and time. He appears well-developed and well-nourished.  HENT:  Head: Normocephalic and atraumatic.  Neck: Neck supple. No thyromegaly present.  Cardiovascular: Normal rate, regular rhythm and normal heart sounds.   Pulmonary/Chest: Effort normal and breath sounds normal.  Lymphadenopathy:    He has no cervical adenopathy.  Neurological: He is alert and oriented to person, place, and time.  Skin: Skin is warm and dry.  Psychiatric: He has a normal mood and affect. His behavior is normal.          Assessment & Plan:  Sublicinal hypothyroid - we discussed the diagnosis and that this does not necessarily have to be treated with thyroid medication. Some episode of subclinical hyperthyroidism does not need to be treated if it is symptomatic he could certainly consider treatment. He has had significant improvement in his excess sweating on the medication which is great. But he says he thinks he would like to try being off the medication began to see how he does without it. If he decides to go back on it needs a refill he can call the office. If he does decide to stay on the medication and we need to recheck his thyroid level in 6-8 weeks. Otherwise recheck thyroid level in 6 months. He wants to come off and see how he does.  F/U in 6 months for recheck thyroid.    Elevated BP - recheck his blood pressure was still high today. Asked him to follow up in one month for a nurse blood pressure check. We'll call the salt diet and try to avoid  smoking before coming into the office.Due for screening lipids and screening glucose.

## 2012-02-16 ENCOUNTER — Ambulatory Visit (INDEPENDENT_AMBULATORY_CARE_PROVIDER_SITE_OTHER): Payer: Commercial Indemnity | Admitting: Family Medicine

## 2012-02-16 VITALS — BP 133/84 | HR 64

## 2012-02-16 DIAGNOSIS — I10 Essential (primary) hypertension: Secondary | ICD-10-CM

## 2012-02-16 NOTE — Progress Notes (Signed)
  Subjective:    Patient ID: Eddie Best, male    DOB: [birth year], 46 y.o.   MRN: [id number]  HPI    Pt denies chest pain, SOB, dizziness, or heart palpitations.  Taking meds as directed w/o problems.  Denies medication side effects.  5 min spent with pt.  Review of Systems     Objective:   Physical Exam        Assessment & Plan:  Hypertension-blood pressure is much improved today. At goal. Followup with me in 4 months. Cipriano Bunker, MD

## 2012-06-04 ENCOUNTER — Emergency Department (INDEPENDENT_AMBULATORY_CARE_PROVIDER_SITE_OTHER)
Admission: EM | Admit: 2012-06-04 | Discharge: 2012-06-04 | Disposition: A | Discharge status: Home / Self Care | Payer: Commercial Indemnity | Source: Home / Self Care | Attending: Emergency Medicine | Admitting: Emergency Medicine

## 2012-06-04 DIAGNOSIS — T63461A Toxic effect of venom of wasps, accidental (unintentional), initial encounter: Secondary | ICD-10-CM

## 2012-06-04 DIAGNOSIS — T6391XA Toxic effect of contact with unspecified venomous animal, accidental (unintentional), initial encounter: Secondary | ICD-10-CM

## 2012-06-04 DIAGNOSIS — T63441A Toxic effect of venom of bees, accidental (unintentional), initial encounter: Secondary | ICD-10-CM

## 2012-06-04 MED ORDER — EPINEPHRINE 0.3 MG/0.3ML IJ DEVI: 0.3000 mg | INTRAMUSCULAR | Status: DC | PRN

## 2012-06-04 MED ORDER — METHYLPREDNISOLONE SODIUM SUCC 125 MG IJ SOLR
125.0000 mg | Freq: Once | INTRAMUSCULAR | Status: AC
  Administered 2012-06-04: 125 mg via INTRAMUSCULAR

## 2012-06-04 MED ORDER — PREDNISONE 20 MG PO TABS: 20.0000 mg | ORAL_TABLET | Freq: Two times a day (BID) | ORAL | Status: AC

## 2012-06-04 NOTE — ED Notes (Signed)
Heart rate at this time is 116, pt is calm, decreased itching, resp. Even and unlabored.

## 2012-06-04 NOTE — ED Notes (Signed)
Stung by bee on leg and finger now itching all over.  Took two benadryl 45 minutes ago

## 2012-06-04 NOTE — ED Provider Notes (Signed)
History     CSN: [id number]  Arrival date & time 06/04/12  1546   First MD Initiated Contact with Patient 06/04/12 1549      Chief Complaint  Patient presents with  . Insect Bite    (Consider location/radiation/quality/duration/timing/severity/associated sxs/prior treatment) HPI This patient complains of a RASH.  He was stung by a bee about 90 minutes ago on his L ankle and L hand.  He has been stung before, but this reaction is worse.  He was doing yardwork, went in and took a warm shower, and then noticed that he was becoming more itchy.  Location: whole body    Course: worsening Self-treated with: Benedryl 50mg  PO              Improvement with treatment: some improvement Other symptoms: headache  History Itching: yes  Tenderness: no  New medications/antibiotics: no  Pet exposure: no  Recent travel or tropical exposure: no  New soaps, shampoos, detergent, clothing: no  Tick/insect exposure: yes, bee sting   Red Flags Feeling ill: no  Fever: no  Facial/tongue swelling/difficulty breathing:  no  Diabetic or immunocompromised: no    History reviewed. No pertinent past medical history.  Past Surgical History  Procedure Date  . Shoulder surgery     Family History  Problem Relation Age of Onset  . Cancer Father     liver    History  Substance Use Topics  . Smoking status: Current Everyday Smoker -- 1.0 packs/day  . Smokeless tobacco: Not on file  . Alcohol Use: No      Review of Systems  All other systems reviewed and are negative.    Allergies  Review of patient's allergies indicates no known allergies.  Home Medications   Current Outpatient Rx  Name Route Sig Dispense Refill  . AMITRIPTYLINE HCL 25 MG PO TABS Oral Take 25 mg by mouth at bedtime.    Marland Kitchen CETIRIZINE HCL 10 MG PO TABS Oral Take 10 mg by mouth daily.      Marland Kitchen EPINEPHRINE 0.3 MG/0.3ML IJ DEVI Intramuscular Inject 0.3 mLs (0.3 mg total) into the muscle as needed. 1 Device 1  .  FLUTICASONE PROPIONATE 50 MCG/ACT NA SUSP Nasal 2 sprays by Nasal route daily. 16 g 2  . LEVOTHYROXINE SODIUM 25 MCG PO TABS Oral Take 1 tablet (25 mcg total) by mouth daily. 30 minutes to an hour before breakfast on an empty stomach. 30 tablet 1  . PREDNISONE 20 MG PO TABS Oral Take 1 tablet (20 mg total) by mouth 2 (two) times daily. 6 tablet 0    BP 129/79  Pulse 144  Temp 98.2 F (36.8 C) (Oral)  Resp 24  Ht 5\' 11"  (1.803 m)  Wt 188 lb (85.276 kg)  BMI 26.22 kg/m2  SpO2 96%  Physical Exam  Nursing note and vitals reviewed. Constitutional: He is oriented to person, place, and time. He appears well-developed and well-nourished. He appears distressed (scratching).  HENT:  Head: Normocephalic and atraumatic.  Mouth/Throat: Oropharynx is clear and moist and mucous membranes are normal. No posterior oropharyngeal edema.  Eyes: No scleral icterus.  Neck: Neck supple.  Cardiovascular: Regular rhythm and normal heart sounds.   Pulmonary/Chest: Effort normal and breath sounds normal. No respiratory distress. He has no decreased breath sounds. He has no wheezes. He has no rhonchi.  Neurological: He is alert and oriented to person, place, and time.  Skin: Skin is warm and dry.       Whole body  erythema with some minor welts especially on back, thighs, and axilla.  Excoriations from itching are present especially on the legs.  Psychiatric: He has a normal mood and affect. His speech is normal.    ED Course  Procedures (including critical care time)  Labs Reviewed - No data to display No results found.   1. Allergic reaction to bee sting       MDM  10 minutes after arrival: 25% subjective improvement, visual decrease of redness on his back 20 minutes after arrival: 50% subjective improvement 30 minutes after arrival: patient stopped scratching 40 minutes after arrival: 70% subjective improvement Patient never had any SOB or breathing problems or sensation of scratchy  throat Pulse ox never below 96% HR dropped from 148 to 114  Patient had a moderate reaction to a bee sting.  We immediately gave him 125 mg Solu-Medrol IM.  We also gave him 40 mg of Pepcid PO and Atarax PO.  Oral hydration in clinic.  Rx given for prednisone for 3 days and Rx for an Epipen to have in case he has a worse reaction in the future.  Patient is watched in clinic for 45 minutes and improves as above.  ER precautions for worsening symptoms later today or tonight.      Marlaine Hind, MD 06/04/12 [phone]

## 2012-08-24 ENCOUNTER — Encounter: Payer: Self-pay | Admitting: Internal Medicine

## 2014-07-12 ENCOUNTER — Ambulatory Visit (INDEPENDENT_AMBULATORY_CARE_PROVIDER_SITE_OTHER): Payer: 59 | Admitting: Physician Assistant

## 2014-07-12 ENCOUNTER — Encounter: Payer: Self-pay | Admitting: Physician Assistant

## 2014-07-12 VITALS — BP 118/80 | HR 91 | Ht 71.0 in | Wt 194.0 lb

## 2014-07-12 DIAGNOSIS — L989 Disorder of the skin and subcutaneous tissue, unspecified: Secondary | ICD-10-CM

## 2014-07-12 DIAGNOSIS — R030 Elevated blood-pressure reading, without diagnosis of hypertension: Secondary | ICD-10-CM

## 2014-07-12 NOTE — Progress Notes (Signed)
   Subjective:    Patient ID: Eddie Best, male    DOB: 08/16/1966, 48 y.o.   MRN: [id number]  HPI Pt presents to the clinic with left sided skin lesion for the last month. No pain or itching associated. Seems to be getting bigger. Not tender to touch. Nothing tried to make better. No fever, chills, n/v/d. Some occasional headaches. Seems to be more popping up going up left temple. Never had anything like this before.    Review of Systems  All other systems reviewed and are negative.      Objective:   Physical Exam  Constitutional: He is oriented to person, place, and time. He appears well-developed and well-nourished.  HENT:  Head: Normocephalic and atraumatic.    Cardiovascular: Normal rate and normal heart sounds.   Pulmonary/Chest: Effort normal and breath sounds normal.  Neurological: He is alert and oriented to person, place, and time.  Psychiatric: He has a normal mood and affect. His behavior is normal.          Assessment & Plan:  Skin lesion- unclear etiology. My colleague Dr. Ivan AnchorsHommel was also brought into the room and was unclear what the dx could be as well.  Will send to dermatology for biopsy and treatment.    Elevated BP- rechecked today in office and was 118/80 and great.

## 2014-07-12 NOTE — Patient Instructions (Signed)
Will refer to dermatology

## 2015-09-04 ENCOUNTER — Ambulatory Visit (INDEPENDENT_AMBULATORY_CARE_PROVIDER_SITE_OTHER): Payer: 59 | Admitting: Sports Medicine

## 2015-09-04 ENCOUNTER — Encounter: Payer: Self-pay | Admitting: Sports Medicine

## 2015-09-04 VITALS — BP 135/82 | HR 100 | Ht 71.0 in | Wt 185.0 lb

## 2015-09-04 DIAGNOSIS — L02411 Cutaneous abscess of right axilla: Secondary | ICD-10-CM | POA: Diagnosis not present

## 2015-09-04 MED ORDER — DOXYCYCLINE HYCLATE 100 MG PO TABS
100.0000 mg | ORAL_TABLET | Freq: Two times a day (BID) | ORAL | Status: DC
Start: 2015-09-04 — End: 2015-09-11

## 2015-09-04 MED ORDER — HYDROCODONE-ACETAMINOPHEN 5-325 MG PO TABS: 1.0000 | ORAL_TABLET | Freq: Three times a day (TID) | ORAL | Status: DC | PRN

## 2015-09-04 NOTE — Patient Instructions (Signed)
Incision and Drainage Care After Refer to this sheet in the next few weeks. These instructions provide you with information on caring for yourself after your procedure. Your caregiver may also give you more specific instructions. Your treatment has been planned according to current medical practices, but problems sometimes occur. Call your caregiver if you have any problems or questions after your procedure. HOME CARE INSTRUCTIONS   If antibiotic medicine is given, take it as directed. Finish it even if you start to feel better.  Only take over-the-counter or prescription medicines for pain, discomfort, or fever as directed by your caregiver.  Keep all follow-up appointments as directed by your caregiver.  Change any bandages (dressings) as directed by your caregiver. Replace old dressings with clean dressings.  Wash your hands before and after caring for your wound. You will receive specific instructions for cleansing and caring for your wound.  SEEK MEDICAL CARE IF:   You have increased pain, swelling, or redness around the wound.  You have increased drainage, smell, or bleeding from the wound.  You have muscle aches, chills, or you feel generally sick.  You have a fever. MAKE SURE YOU:   Understand these instructions.  Will watch your condition.  Will get help right away if you are not doing well or get worse. Document Released: 02/14/2012 Document Reviewed: 02/14/2012 [employer] Patient Information 2015 [employer]. This information is not intended to replace advice given to you by your health care Eddie Best. Make sure you discuss any questions you have with your health care Eddie Best.  

## 2015-09-04 NOTE — Assessment & Plan Note (Signed)
Complex incision and drainage of 2 axillary abscesses with packing. Wound cultures taken, doxycycline, hydrocodone. Return to see me in one week, remove 1/2 inch of packing per day.

## 2015-09-04 NOTE — Progress Notes (Signed)
   Subjective:    I'm seeing this patient as a consultation for:  Dr. Linford Arnold  CC: Axillary abscess  HPI: For the past several days this pleasant 49 year old male has had increasing pain and swelling, as well as nodules under the right armpit. Symptoms are severe, worsening.   Past medical history, Surgical history, Family history not pertinant except as noted below, Social history, Allergies, and medications have been entered into the medical record, reviewed, and no changes needed.   Review of Systems: No headache, visual changes, nausea, vomiting, diarrhea, constipation, dizziness, abdominal pain, skin rash, fevers, chills, night sweats, weight loss, swollen lymph nodes, body aches, joint swelling, muscle aches, chest pain, shortness of breath, mood changes, visual or auditory hallucinations.   Objective:   General: Well Developed, well nourished, and in no acute distress.  Neuro/Psych: Alert and oriented x3, extra-ocular muscles intact, able to move all 4 extremities, sensation grossly intact. Skin: Warm and dry, no rashes noted.  Respiratory: Not using accessory muscles, speaking in full sentences, trachea midline.  Cardiovascular: Pulses palpable, no extremity edema. Abdomen: Does not appear distended. Right axilla: there is carbunculosis, with 2 large abscesses.  Complicated Incision and drainage of abscess. Risks, benefits, and alternatives explained and consent obtained. Time out conducted. Surface cleaned with alcohol. 10cc lidocaine with epinephine infiltrated around abscess. Adequate anesthesia ensured. Area prepped and draped in a sterile fashion. #11 blade used to make a stab incision into abscess. Pus expressed with pressure. Curved hemostat used to explore 4 quadrants and loculations broken up. Further purulence expressed. Iodoform packing placed leaving a 1-inch tail. Hemostasis achieved. Pt stable. Aftercare and follow-up advised. This procedure was repeated  on the second abscess, a wound culture was obtained.  Impression and Recommendations:   This case required medical decision making of moderate complexity.

## 2015-09-07 LAB — WOUND CULTURE
Gram Stain: NONE SEEN
Gram Stain: NONE SEEN

## 2015-09-11 ENCOUNTER — Encounter: Payer: Self-pay | Admitting: Sports Medicine

## 2015-09-11 ENCOUNTER — Ambulatory Visit (INDEPENDENT_AMBULATORY_CARE_PROVIDER_SITE_OTHER): Payer: 59 | Admitting: Sports Medicine

## 2015-09-11 DIAGNOSIS — L02411 Cutaneous abscess of right axilla: Secondary | ICD-10-CM

## 2015-09-11 MED ORDER — DOXYCYCLINE HYCLATE 100 MG PO TABS: 100.0000 mg | ORAL_TABLET | Freq: Two times a day (BID) | ORAL | Status: AC

## 2015-09-11 NOTE — Progress Notes (Signed)
  Subjective:    CC: Follow-up  HPI: 1 week post incision and drainage of 2 right axillary abscesses with packing, packing is removed and he is doing significantly better. Wound culture did grow out methicillin sensitive Staphylococcus aureus.  Past medical history, Surgical history, Family history not pertinant except as noted below, Social history, Allergies, and medications have been entered into the medical record, reviewed, and no changes needed.   Review of Systems: No fevers, chills, night sweats, weight loss, chest pain, or shortness of breath.   Objective:    General: Well Developed, well nourished, and in no acute distress.  Neuro: Alert and oriented x3, extra-ocular muscles intact, sensation grossly intact.  HEENT: Normocephalic, atraumatic, pupils equal round reactive to light, neck supple, no masses, no lymphadenopathy, thyroid nonpalpable.  Skin: Warm and dry, no rashes. Cardiac: Regular rate and rhythm, no murmurs rubs or gallops, no lower extremity edema.  Respiratory: Clear to auscultation bilaterally. Not using accessory muscles, speaking in full sentences. Right axilla: There is some contact dermatitis with blistering from the tape, otherwise the abscesses are healing well, still tender, but no erythema, no purulence.  Impression and Recommendations:

## 2015-09-11 NOTE — Assessment & Plan Note (Signed)
Both abscesses are healing well, we are going to do an additional 7 days of doxycycline. Return as needed. Cultures grew out doxycycline sensitive Staphylococcus aureus.

## 2016-02-10 ENCOUNTER — Ambulatory Visit (INDEPENDENT_AMBULATORY_CARE_PROVIDER_SITE_OTHER): Payer: 59 | Admitting: Osteopathic Medicine

## 2016-02-10 ENCOUNTER — Encounter: Payer: Self-pay | Admitting: Osteopathic Medicine

## 2016-02-10 VITALS — BP 112/75 | HR 103 | Temp 98.2°F | Ht 71.0 in | Wt 182.0 lb

## 2016-02-10 DIAGNOSIS — B9789 Other viral agents as the cause of diseases classified elsewhere: Principal | ICD-10-CM

## 2016-02-10 DIAGNOSIS — J069 Acute upper respiratory infection, unspecified: Secondary | ICD-10-CM

## 2016-02-10 MED ORDER — HYDROCODONE-HOMATROPINE 5-1.5 MG/5ML PO SYRP: 5.0000 mL | ORAL_SOLUTION | Freq: Three times a day (TID) | ORAL | Status: DC | PRN

## 2016-02-10 MED ORDER — IPRATROPIUM BROMIDE 0.03 % NA SOLN: 2.0000 | Freq: Three times a day (TID) | NASAL | Status: DC

## 2016-02-10 NOTE — Patient Instructions (Signed)
DR. Kristofer Schaffert'S HOME CARE INSTRUCTIONS: UPPER RESPIRATORY ILLNESS AND SINUSITIS   FRIST, A FEW NOTES ON OVER-THE-COUNTER MEDICATIONS!  . USE CAUTION - MANY OVER-THE-COUNTER MEDICATIONS COME IN COMBINATIONS OF MULTIPLE GENERICS. FOR INSTANCE, NYQUIL HAS TYLENOL + COUGH MEDICINE + AN ANTIHISTAMINE, SO BE CAREFUL YOU'RE NOT TAKING A COMBINATION MEDICINE WHICH CONTAINS MEDICATIONS YOU'RE ALSO TAKING SEPARATELY (LIKE NYQUIL SYRUP AS WELL AS TYLENOL PILLS).  . YOUR PHARMACIST CAN HELP YOU AVOID MEDICATION INTERACTIONS AND DUPLICATIONS - ASK FOR THEIR HELP IF YOU ARE CONFUSED OR UNSURE ABOUT WHAT TO PURCHASE OVER-THE-COUNTER!  . REMEMBER - IF YOU'RE EVER CONCERNED ABOUT MEDICATION SIDE EFFECTS, OR IF YOU'RE EVER CONCERNED YOUR SYMPTOMS ARE GETTING WORSE DESPITE TREATMENT, PLEASE CALL THE OFFICE!   TREAT SINUS CONGESTION, RUNNY NOSE & POSTNASAL DRIP: . Treat to increase airflow through sinuses, decrease congestion pain and prevent bacterial growth!  . Remember, only 0.5-2% of sinus infections are due to a bacteria, the rest are due to a virus (usually the common cold)! Trust your doctor when he or she decides whether or not you really need an antibiotic!   NASAL SPRAYS: generally safe and should not interact with other medicines. Can take any of these medications, either alone or together... FLONASE (FLUTICASONE) - 2 sprays in each nostril twice per day (also a great allergy medicine to use long-term!) AFRIN (OXYMETOLAZONE) - use sparingly because it will cause rebound congestion, NEVER USE IN KIDS   SALINE NASAL SPRAY- no limit, but avoid use after other nasal sprays or it can wash the medicine away PRESCRTIPTION ATROVENT - as directed on prescription bottle  ANTIHISTAMINES: Helps dry out runny nose and decreases postnasal drip. Benadryl may cause drowsiness but other preparations should not be as sedating. Certain kinds are not as safe in elderly individuals. OK to use unless Dr A says otherwise.   Only use one of the following... BENADRYL (DIPHENHYDRAMINE) - 25-50 mg every 6 hours ZYRTEC (CETIRIZINE) - 5-10 mg daily CLARITIN (LORATIDINE) - less potent. 10 mg daily ALLEGRA (FEXOFENADINE) - least likely to cause drowsiness! 180 mg daily or 60 mg twice per day  DECONGESTANTS: Helps dry out runny nose and helps with sinus pain. May cause insomnia, or sometimes elevated heart rate. Can cause problems if used often in people with high blood pressure. OK to use unless Dr A says otherwise. NEVER USE IN KIDS UNDER 2 YEARS OLD. Only use one of the following... SUDAFED (PSEUDOEPHEDINE) - 60 mg every 4 - 6 hours, also comes in 120 mg extended release every 12 hours, maximum 240 mg in 24 hours.  SUDAED PE (PHENYLEPHRINE) - 10 mg every 4 - 6 hours, maximum 60 mg per day  COMBINATIONS OF ANTIHISTAMINE + DECONGESTANT: these usually require you to show your ID at the pharmacy counter. You can also purchase these medicines separately as noted above.  Only use one of the following... ZYRTEC-D (CETIRIZINE + PSEUDOEPHEDRINE) - 12 hour formulation as directed CLARITIN-D (LORATIDINE + PSEUDOEPHEDRINE) - 12 and 24 hour formulations as directed ALLEGRA-D (FEXOFENADINE + PSEUDOEPHEDRINE) - 12 and 24 hour formulations as directed  PRESCRIPTION TREATMENT FOR SINUS PROBLEMS: Can include nasal sprays, pills, or antibiotics in the case of true bacterial infection. Not everyone needs an antibiotic but there are other medicines which will help you feel better while your body fights the infection!    TREAT COUGH & SORE THROAT: . Remember, cough is the body's way of protecting your airways and lungs, it's a hard-wired reflex that is tough for medicines to   treat!  . Irritation to the airways will cause cough. This irritation is usually caused by upper airway problems like postnasal drip (treat as above) and viral sore throat, but in severe cases can be due to lower airway problems like bronchitis or pneumonia, which a  doctor can usually hear on exam of your lungs or an X-ray. . Sore throat is almost always due to a virus, but occasionally caused by Strep, which requires antibiotics.  . Exercise and smoking may make cough worse - take it easy while you're sick, and QUIT SMOKING!   . Cough due to viral infection can linger for 2 weeks or so. If you're coughing longer than you think you should, or if the cough is severe, please make an appointment in the office - you may need a chest X-ray.   EXPECTORANT: Used to help clear airways, take these with PLENTY of water to help thin mucus secretions and make the mucus easier to cough up   Only use one of the following... ROBITUSSIN (DEXTROMETHORPHAN OR GUAIFENISEN depending on formulation)  MUCINEX (GUAIFENICEN) - usually longer acting  COUGH DROPS/LOZENGES: Whichever over-the-counter agent you prefer!  Here are some suggestions for ingredients to look for (can take both)... BENZOCAINE - numbing effect, also in CHLORASEPTIC THROAT SPRAY MENTHOL - cooling effect  HONEY: has gone head-to-head in several clinical trials with prescription cough medicines and found to be equally effective! Try 1 Teaspoon Honey + 2 Drops Lemon Juice, as much as you want to use. NONE FOR KIDS UNDER AGE 2  HERBAL TEA: There are certain ingredients which help "coat the throat" to relieve pain  such as ELM BARK, LICORICE ROOT, MARSHMALLOW ROOT  PRESCRIPTION TREATMENT FOR COUGH: Reserved for severe cases. Can include pills, syrups or inhalers.    TREAT ACHES & PAINS, FEVER: . Illness causes aches, pains and fever as your body increases its natural inflammation response to help fight the infection.  . Rest, good hydration and nutrition, and taking anti-inflammatory medications will help.  . Remember: a true fever is a temperature 100.4 or higher. If you have a fever that is 105.0 or higher, that is a dangerous level and needs medical attention in the office or in the ER!    Can take both  of these together... IBUPROFEN - 400-800 mg every 6 - 8 hours. Ibuprofen and similar medications can cause problems for people with heart disease or history of stomach ulcers, check with Dr A first if you're concerned. Lower doses are usually safe and effective.  TYLENOL (ACETAMINOPHEN) - 500-1000 mg every 6 hours. It won't cause problems with heart or stomach.   TREAT GASTROINTESTINAL SYMPTOMS: . Hydrate, hydrate, hydrate! Try drinking Gatorade/Powerade, broth/soup. Avoid milk and juice, these can make diarrhea worse. You can drink water, of course, but if you are also having vomiting and loose stool you are losing electrolytes which water alone can't replace.   IMMODIUM (LOPERAMIDE) - as directed on the bottle to help with loose stool PRESCRIPTION ZOFRAN (ONDANSETRON) OR PHENERGAN (PROMETHAZINE) - as directed to help nausea and vomiting. Try taking these before you eat if you are having trouble keeping food down.     REMEMBER - THE MOST IMPORTANT THINGS YOU CAN DO TO AVOID CATCHING OR SPREADING ILLNESS INCLUDE:  . COVER YOUR COUGH WITH YOUR ARM, NOT WITH YOUR HANDS!  . DISINFECT COMMONLY USED SURFACES (SUCH AS TELEPHONES & DOORKNOBS) WHEN YOU OR SOMEONE CLOSE TO YOU IS FEELING SICK!  . BE SURE VACCINES ARE UP   TO DATE - GET A FLU SHOT EVERY YEAR! . GOOD NUTRITION AND HEALTHY LIFESTYLE WILL HELP YOUR IMMUNE SYSTEM YEAR-ROUND! . AND ABOVE ALL - WASH YOUR HANDS! 

## 2016-02-10 NOTE — Progress Notes (Signed)
HPI: Eddie Best is a 50 y.o. male who [facility] for chief complaint of:  Chief Complaint  Patient presents with  . Cough    Present for 3 weeks    . Location: chest . Quality: congestion, cough . Assoc signs/symptoms: see ROS . Duration: started 2 days ago with musculoskeletal pain, dry coughing for 3 weeks or so . Modifying factors: has tried the following OTC/Rx medications: Alka-Seltzer COld Plus, Ibuprofen  . Context:  (+) tobacco abuse   Past medical, social and family history reviewed. Current medications and allergies reviewed.     Review of Systems: CONSTITUTIONAL: yes fever/chills HEAD/EYES/EARS/NOSE/THROAT: yes headache, no vision change or hearing change, yes sore throat CARDIAC: No chest pain/pressure/palpitations RESPIRATORY: yes cough, nonproductive, no shortness of breath GASTROINTESTINAL: no nausea, no vomiting, no abdominal pain, no diarrhea MUSCULOSKELETAL: yes myalgia/arthralgia   Exam:  BP 112/75 mmHg  Pulse 103  Temp(Src) 98.2 F (36.8 C) (Oral)  Ht 5\' 11"  (1.803 m)  Wt 182 lb (82.555 kg)  BMI 25.40 kg/m2 Constitutional: VSS, see above. General Appearance: alert, well-developed, well-nourished, NAD Eyes: Normal lids and conjunctive, non-icteric sclera, PERRLA Ears, Nose, Mouth, Throat: Normal external inspection ears/nares/mouth/lips/gums, normal TM, MMM;       posterior pharynx with erythema, without exudate Neck: No masses, trachea midline. normal lymph nodes Respiratory: Normal respiratory effort. No  wheeze/rhonchi/rales Cardiovascular: S1/S2 normal, no murmur/rub/gallop auscultated. RRR.   ASSESSMENT/PLAN: Symptoms and exam consistent with viral illness. If no improvement 7-10 days, can call office and would consider antibiotics for sinusitis. If cough worsening, develop significant fever or shortness of breath, RTC/ER for further evaluation. Tobacco abuse also an issue. Consider  chest x-ray if no improvement, normal lung exam today.  Viral URI with cough - Plan: HYDROcodone-homatropine (HYCODAN) 5-1.5 MG/5ML syrup, ipratropium (ATROVENT) 0.03 % nasal spray    Return if symptoms worsen or fail to improve.

## 2016-02-11 ENCOUNTER — Ambulatory Visit: Payer: 59 | Admitting: Family Medicine

## 2017-09-12 ENCOUNTER — Encounter: Payer: Self-pay | Admitting: Family Medicine

## 2017-09-12 ENCOUNTER — Ambulatory Visit (INDEPENDENT_AMBULATORY_CARE_PROVIDER_SITE_OTHER): Payer: 59 | Admitting: Family Medicine

## 2017-09-12 VITALS — BP 123/73 | HR 88 | Ht 71.0 in | Wt 183.0 lb

## 2017-09-12 DIAGNOSIS — T23261A Burn of second degree of back of right hand, initial encounter: Secondary | ICD-10-CM

## 2017-09-12 DIAGNOSIS — T2020XA Burn of second degree of head, face, and neck, unspecified site, initial encounter: Secondary | ICD-10-CM

## 2017-09-12 NOTE — Patient Instructions (Signed)
After 3 days switch to vitamin E.
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
